# Patient Record
Sex: Female | Born: 2005 | Race: Black or African American | Hispanic: No | Marital: Single | Smoking: Never smoker
Health system: Southern US, Community
[De-identification: ages and names within clinical notes are randomized; demographics above are authoritative.]

## PROBLEM LIST (undated history)

## (undated) HISTORY — PX: OTHER SURGICAL HISTORY: SHX169

---
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
## 2023-07-15 ENCOUNTER — Encounter: Payer: Self-pay | Admitting: Neurology

## 2023-07-15 ENCOUNTER — Ambulatory Visit: Payer: BC Managed Care – PPO | Attending: No Specialty | Admitting: Neurology

## 2023-07-15 VITALS — BP 117/77 | HR 52 | Temp 98.3°F | Resp 18 | Ht 59.0 in | Wt 118.4 lb

## 2023-07-15 DIAGNOSIS — R519 Headache, unspecified: Secondary | ICD-10-CM

## 2023-07-15 DIAGNOSIS — G43719 Chronic migraine without aura, intractable, without status migrainosus: Secondary | ICD-10-CM

## 2023-07-15 MED ORDER — NORTRIPTYLINE HCL 10 MG PO CAPS
10.0000 mg | ORAL_CAPSULE | Freq: Every evening | ORAL | 5 refills | Status: DC
Start: 2023-07-15 — End: 2023-08-06

## 2023-07-15 MED ORDER — RIZATRIPTAN BENZOATE 10 MG PO TBDP
10.0000 mg | ORAL_TABLET | Freq: Two times a day (BID) | ORAL | 5 refills | Status: DC | PRN
Start: 2023-07-15 — End: 2023-11-11

## 2023-07-15 NOTE — Progress Notes (Signed)
07/15/2023    RE:  Heather Escobar  PCP:  Pcp, None, MD   Referring Provider: Self-referred    Chief Complaint:   Chief Complaint   Patient presents with    Headache     History of Present Illness:  Heather Escobar is a 17 y.o. female who is self-referred for headache evaluation. She is accompanied by her mother, who helps with the history.     She has had headache x at least 3 years, onset sometime in high school.   +Family history in mother.   No clear antecedent accident, injury, illness.   She did have minor concussion with gymnastics in the past.   Intermittent at first.   Gradually worsening.   Now constant, daily since x several months.   This pain is frontal; squeezing; mild.   More intense pain can be holocranial and shoots from the base of neck up to posterior occiput.   More intense pain is sharp, throbbing.   More intense pain is associated with nausea, light and sound sensitivity.   Treating with Acetaminophen, sleep.   Pain is 30 days per month/10 days pain is severe.   She had a CT scan done in ED in [place]; results not available at time of visit.   She is using Acetaminophen a few days per week.   6-7 hours of sleep per day.   Drinking minimal water during the day.   Not eating regularly.     Previous Preventive Medications:  N/a    Previous Acute Medications:  Acetaminophen  ASA-Acetaminophen-Caffeine  Methylprednisolone --> didn't help headache    Past Medical History:  History reviewed. No pertinent past medical history.    Past Surgical History:  History reviewed. No pertinent surgical history.    Allergies:  Patient has no known allergies.    Family History:  History reviewed. No pertinent family history.  No other family history related to current complaints.    Social History:  Social History[1]    Medications:  Current Medications[2]    General Exam:  BP 117/77 (BP Site: Left arm, Patient Position: Sitting, Cuff Size: Medium)   Pulse 52   Temp 98.3 F (36.8 C) (Oral)   Resp 18   Ht 1.499 m (4\' 11" )    Wt 53.7 kg (118 lb 6.4 oz)   SpO2 96%   BMI 23.91 kg/m   Gen:  Well-developed, well-nourished.  No acute distress.  Cooperative with exam.  HEENT:  NC/AT.  Neck:  Supple. Normal range of motion.   CV:  RRR.  S1/S2.  No murmurs.    Lungs:  CTA bilaterally.   Extrem:  No edema.  Skin:  No obvious rashes or lesions.   Neuro Exam:  MENTAL STATUS:  Awake, alert, oriented x 3.  Affect is normal. Normal attention and concentration.  Follows commands.  Normal affect and mood.  Speech is fluent, non dysarthric.  No apraxia or neglect.  Remote and short term/recent memory intact. Fund of knowledge appropriate for age/education.    CRANIAL NERVES:    CN I - Not tested.   CN II - Visual fields are full to confrontation.   CN III, IV, VI - PERRL.  EOMI.  No nystagmus.   CN V - Intact facial sensation to light touch.  CN VII - Facial movements symmetric.   CN VIII - Hearing intact to conversational speech.   CN IX, X - Palate elevates symmetrically. Normal phonation.  CN XI - Shoulder shrug  symmetric.   CN XII - Tongue protrusion midline and without fasciculations.   MOTOR:  Normal bulk and tone.  No pronator drift.  Strength is full and symmetric.  No asterixis, tremor, myoclonus or other abnormal movements.   SENSATION:  Intact to light touch.   GAIT:  Normal gait and station.     Investigations:  Labs Reviewed:  No results found for: "WBC", "HGB", "HCT", "MCV", "PLT"  '  Chemistry    No results found for: "NA", "K", "CL", "CO2", "BUN", "CREAT", "GLU" No results found for: "CA", "ALKPHOS", "AST", "ALT", "BILITOTAL"     Imaging:  CT Head Without Contrast 10/25/2008:  IMPRESSION:     NORMAL CT SCAN OF THE HEAD.     Other Studies:  N/a    Records Reviewed:  N/a    Impression:  In summary, Heather Escobar is a 17 y.o. female who presents for headache evaluation. Primary headache type is migraine without aura.  Symptoms are uncontrolled. Given progression from intermittent to daily headache, recommend neuroimaging to exclude a  structural abnormality.     Recommendations:  1. Worsening headaches    2. Intractable chronic migraine without aura and without status migrainosus    -MRI Brain Without Contrast  -Start Nortriptyline 10 mg qHS; Medication dosing schedule and potential side effects were reviewed.  -Trial of Rizatriptan for acute relief; Medication dosing schedule and potential side effects were reviewed.  -Patient was counseled via AVS to follow the SEEDS for success in headache management, including Sleep hygiene, Exercising regularly, Eating healthy and regular meals, Drinking water, keeping a headache Diary, and Stress reduction.  -Patient was counseled via AVS on the risk of acute medication overuse headache and was advised to limit acute medication use to no more than 3 days per week    Follow up in 3 months for continued longitudinal management of chronic disorder.     Adonis Huguenin, MD  [facility]  Board Certified in Adult Neurology by the American Board of Psychiatry and Neurology (ABPN)  Board Certified in Headache Medicine by the [employer] (UCNS)  07/15/2023                 [1]   Social History  Tobacco Use    Smoking status: Never    Smokeless tobacco: Never   Vaping Use    Vaping status: Never Used   Substance Use Topics    Alcohol use: Never    Drug use: Never   [2]   Current Outpatient Medications   Medication Sig Dispense Refill    albuterol sulfate HFA (PROVENTIL) 108 (90 Base) MCG/ACT inhaler Inhale 2 puffs into the lungs every 6 (six) hours as needed       No current facility-administered medications for this visit.

## 2023-07-15 NOTE — Patient Instructions (Addendum)
-  Follow the SEEDS for success in headache management, including Sleep hygiene, Exercising regularly, Eating healthy and regular meals, Drinking water, keeping a headache Diary, and Stress reduction.    -Work on one lifestyle habit at a time   -Getting 8 hours of sleep   -Drinking 72 oz of water   -3 days of exercise per week   -Eating protein at each of your meals and eating 3 meals per day, around the same times    -I've ordered imaging.  You can schedule at any imaging facility that you'd like.  Many patients use:   [facility] [phone]   [facility] [phone]   [facility] [phone]    -Starting Nortriptyline 10 mg nightly for headache prevention    -When you have a more severe headache, try taking Maxalt (generic Rizatriptan).  Take 10 mg pill by mouth at headache onset.  If you still have a headache after 2 hours, you can take a second dose.  Do not take more than 2 pills in 24 hours.  Do not use more than 3 days per week to prevent rebound headaches.  This may cause a flushing reaction.  Please notify your provider if other side effects occur.     -Avoid use of any "triptans" or over-the-counter rescue/abortive medications (e.g. Advil/Ibuprofen, Aleve/Naproxen, Tylenol/Acetaminophen, Excedrin/Aspirin-Acetaminophen-Caffeine) more than 3 days per week to prevent rebound headaches    -Follow up in 4 months

## 2023-07-21 ENCOUNTER — Encounter: Payer: Self-pay | Admitting: Neurology

## 2023-08-04 ENCOUNTER — Ambulatory Visit: Payer: BC Managed Care – PPO | Attending: Neurology

## 2023-08-04 DIAGNOSIS — R519 Headache, unspecified: Secondary | ICD-10-CM | POA: Insufficient documentation

## 2023-08-04 DIAGNOSIS — G43719 Chronic migraine without aura, intractable, without status migrainosus: Secondary | ICD-10-CM | POA: Insufficient documentation

## 2023-08-06 ENCOUNTER — Other Ambulatory Visit: Payer: Self-pay | Admitting: Neurology

## 2023-10-13 ENCOUNTER — Emergency Department
Admission: EM | Admit: 2023-10-13 | Discharge: 2023-10-13 | Disposition: A | Discharge status: Home / Self Care | Payer: BC Managed Care – PPO | Attending: Emergency Medicine | Admitting: Emergency Medicine

## 2023-10-13 DIAGNOSIS — J020 Streptococcal pharyngitis: Secondary | ICD-10-CM | POA: Insufficient documentation

## 2023-10-13 LAB — COVID-19 (SARS-COV-2) & INFLUENZA  A/B, NAA (ROCHE LIAT)
Influenza A RNA: NOT DETECTED
Influenza B RNA: NOT DETECTED
SARS-CoV-2 (COVID-19) RNA: NOT DETECTED

## 2023-10-13 LAB — GROUP A STREP, RAPID ANTIGEN: Group A Strep, Rapid Antigen: POSITIVE — AB

## 2023-10-13 MED ORDER — IBUPROFEN 100 MG/5ML PO SUSP
400.0000 mg | Freq: Once | ORAL | Status: AC
Start: 2023-10-13 — End: 2023-10-13
  Administered 2023-10-13: 400 mg via ORAL
  Filled 2023-10-13: qty 20

## 2023-10-13 MED ORDER — ACETAMINOPHEN 160 MG/5ML PO SUSP/SOLN (WRAP)
650.0000 mg | Freq: Once | ORAL | Status: AC
Start: 2023-10-13 — End: 2023-10-13
  Administered 2023-10-13: 650 mg via ORAL
  Filled 2023-10-13 (×2): qty 20.3

## 2023-10-13 MED ORDER — DEXAMETHASONE SODIUM PHOSPHATE 4 MG/ML IJ SOLN (WRAP) - FOR ORAL USE
8.0000 mg | Freq: Once | INTRAMUSCULAR | Status: AC
Start: 2023-10-13 — End: 2023-10-13
  Administered 2023-10-13: 8 mg via ORAL

## 2023-10-13 MED ORDER — AMOXICILLIN 500 MG PO CAPS
500.0000 mg | ORAL_CAPSULE | Freq: Two times a day (BID) | ORAL | 0 refills | Status: AC
Start: 2023-10-13 — End: 2023-10-20

## 2023-10-13 MED ORDER — ACETAMINOPHEN 160 MG/5ML PO SUSP/SOLN (WRAP)
650.0000 mg | Freq: Once | ORAL | Status: DC
Start: 2023-10-13 — End: 2023-10-13

## 2023-10-13 MED ORDER — AMOXICILLIN-POT CLAVULANATE 400-57 MG/5ML PO SUSR
875.0000 mg | Freq: Once | ORAL | Status: AC
Start: 2023-10-13 — End: 2023-10-13
  Administered 2023-10-13: 875.2 mg via ORAL
  Filled 2023-10-13: qty 1

## 2023-10-13 NOTE — ED Notes (Signed)
 Discharge orders made by provider, health education imparted, advised follow up as needed, advised to come back to ED if symptoms persist for re-evaluation. Left ED in stable condition, patient alert and oriented X 4. No further complaints made.

## 2023-10-13 NOTE — Discharge Instructions (Addendum)
You are seen today for a sore throat.  Your evaluation here was reassuring but did reveal strep pharyngitis.  You will be treated with antibiotics.  Use Tylenol and ibuprofen to help with your symptoms.  Drink tea with honey as well.  Follow-up with your regular doctor.    Return to the ED for worsening symptoms that cannot be controlled at home or if you develop any other concerning symptoms

## 2023-10-13 NOTE — ED Provider Notes (Signed)
 EMERGENCY DEPARTMENT NOTE     Patient initially seen and examined at   ED PHYSICIAN ASSIGNED       Date/Time Event User Comments    10/13/23 0308 Physician Assigned Vangie Bicker, MD assigned as Attending           ED MIDLEVEL (APP) ASSIGNED       None            HISTORY OF PRESENT ILLNESS       Chief Complaint: No chief complaint on file.       17 y.o. female with past medical history as below    Presenting with sore throat onset yesterday.  Patient woke up in the morning with a sore throat, was seen in urgent care and had negative strep and COVID and flu testing.  Tonight was having headaches worsening sore throat and a fever.  Mom reporting fever of 108.0 at home but patient maintained normal sensorium.  Also has painful tender lymph node on the right side.  No cough congestion rhinorrhea sore throat nausea vomiting    Independent Historian (other than patient): No  Additional History Provided by Independent Historian:  MEDICAL HISTORY     Past Medical History:  History reviewed. No pertinent past medical history.    Past Surgical History:  Past Surgical History[1]    Social History:  Social History[2]    Family History:  Family History[3]    Outpatient Medication:  Previous Medications    ALBUTEROL SULFATE HFA (PROVENTIL) 108 (90 BASE) MCG/ACT INHALER    Inhale 2 puffs into the lungs every 6 (six) hours as needed    NORTRIPTYLINE (PAMELOR) 10 MG CAPSULE    TAKE 1 CAPSULE BY MOUTH NIGHTLY.    RIZATRIPTAN (MAXALT-MLT) 10 MG DISINTEGRATING TABLET    Take 1 tablet (10 mg) by mouth 2 (two) times daily as needed for Migraine .  Separate doses by at least 2 hours.  Max of 2 doses in 24 hours. Do not use more than 3 days per week.         REVIEW OF SYSTEMS   See History of Present Illness  PHYSICAL EXAM     ED Triage Vitals [10/13/23 0301]   Encounter Vitals Group      BP 123/70      Systolic BP Percentile       Diastolic BP Percentile       Heart Rate 116      Resp Rate 20      Temp (!) 101.8 F (38.8 C)       Temp Source Temporal      SpO2 99 %      Weight 54 kg      Height 1.499 m      Head Circumference       Peak Flow       Pain Score 9      Pain Loc       Pain Education       Exclude from Growth Chart      Physical Exam     General:  Well-appearing, NAD.   Skin:  Warm, dry. No rash.   Head:  Normocephalic.  Atraumatic.   Neck: Normal ROM, supple.   EENT: EOMI. PERRL. Oropharynx moist.  Tonsillar swelling, erythema, no exudates.  Anterior cervical lymphadenopathy  CV:  RRR, no MRG, 2+ radial  pulses bilaterally. Cap refill <2s.   Respiratory:  CTAB, no W/R/R. Respirations are non-labored.   GI:  Soft, nondistended, nontender.   MSK: No obvious deformity. Grossly normal AROM at major joints.   Neuro:  Alert and oriented.  Clear & fluent speech.    MEDICAL DECISION MAKING     PRIMARY PROBLEM LIST      Acute illness/injury with risk to life or bodily function (based on differential diagnosis or evaluation) DIAGNOSIS: Strep pharyngitis         DISCUSSION      17 year old female presenting with sore throat.  Found to have strep pharyngitis.  Rest of exam reassuring.  Will treat with antibiotics and symptomatic control    If patient is being hospitalized is severe sepsis or septic shock suspected?: N/A          External Records Reviewed?: N/A    Additional Notes              ED Course as of 10/13/23 0353   Tue Oct 13, 2023   0337 Group A Strep, Rapid Antigen(!): Positive [TL]      ED Course User Index  [TL] Steffanie Dunn, MD         Vital Signs: Reviewed the patient's vital signs.   Nursing Notes: Reviewed and utilized available nursing notes.  Medical Records Reviewed: Reviewed available past medical records.  Counseling: The emergency Gaynel Schaafsma has spoken with the patient and discussed today's findings, in addition to providing specific details for the plan of care.  Questions are answered and there is agreement with the plan.      MIPS DOCUMENTATION              CARDIAC STUDIES    The following cardiac studies were  independently interpreted by me the Emergency Medicine Haidy Kackley.  For full cardiac study results please see chart.                                                EMERGENCY IMAGING STUDIES    The following imagine studies were independently interpreted by me (emergency medicine Ieshia Hatcher):                       RADIOLOGY IMAGING STUDIES      No orders to display       EMERGENCY DEPT. MEDICATIONS      ED Medication Orders (From admission, onward)      Start Ordered     Status Ordering Lorayne Getchell    10/13/23 [phone] 10/13/23 0345    Once        Route: Oral  Ordered Dose: 650 mg       Discontinued Steffanie Dunn    10/13/23 0324 10/13/23 0323  acetaminophen (TYLENOL) 160 MG/5ML oral liquid 650 mg  Once        Route: Oral  Ordered Dose: 650 mg       Wendall Stade    10/13/23 0324 10/13/23 0323  ibuprofen (ADVIL) 100 MG/5ML oral suspension 400 mg  Once        Route: Oral  Ordered Dose: 400 mg       Last MAR action: Given ,     10/13/23 0324 10/13/23 0323  dexAMETHasone (DECADRON) INJ soln for ORAL ROUTE 8 mg  Once        Route: Oral  Ordered Dose: 8 mg       Last MAR action: Given ,   10/13/23 0324 10/13/23 0323  amoxicillin-clavulanate (AUGMENTIN) 400-57 MG/5ML oral suspension 875.2 mg  Once        Route: Oral  Ordered Dose: 875 mg of amoxicillin       Last MAR action: Given ,             LABORATORY RESULTS    Ordered and independently interpreted AVAILABLE laboratory tests.   Results       Procedure Component Value Units Date/Time    COVID-19 and Influenza ([place]) (symptomatic) [[id number]]  (Normal) Collected: 10/13/23 0312    Specimen: Swab from Anterior Nares Updated: 10/13/23 0351     SARS-CoV-2 (COVID-19) RNA Not Detected     Influenza A RNA Not Detected     Influenza B RNA Not Detected    Narrative:      A result of "Detected" indicates POSITIVE for the presence of viral RNA  A result of "Not Detected" indicates NEGATIVE for the presence of viral RNA    Test performed using  the Roche cobas [place] SARS-CoV-2 & Influenza A/B assay. This is a multiplex real-time RT-PCR assay for the detection of SARS-CoV-2, influenza A, and influenza B virus RNA. Viral nucleic acids may persist in vivo, independent of viability. Detection of viral nucleic acid does not imply the presence of infectious virus, or that virus nucleic acid is the cause of clinical symptoms. Negative results do not preclude SARS-CoV-2, influenza A, and/or influenza B infection and should not be used as the sole basis for diagnosis, treatment or other patient management decisions. Invalid results may be due to inhibiting substances in the specimen and recollection should occur.     Group A Strep, Rapid Antigen [[id number]]  (Abnormal) Collected: 10/13/23 0312    Specimen: Swab from Throat Updated: 10/13/23 0334     Group A Strep, Rapid Antigen Positive              CRITICAL CARE/PROCEDURES    Procedures  Critical care?  DIAGNOSIS      Diagnosis:  Final diagnoses:   Strep pharyngitis       Disposition:  ED Disposition       ED Disposition   Discharge    Condition   --    Date/Time   Tue Oct 13, 2023  3:46 AM    Comment   [name] discharge to home/self care.    Condition at disposition: Stable                 Prescriptions:  Patient's Medications   New Prescriptions    AMOXICILLIN (AMOXIL) 500 MG CAPSULE    Take 1 capsule (500 mg) by mouth 2 (two) times daily for 7 days   Previous Medications    ALBUTEROL SULFATE HFA (PROVENTIL) 108 (90 BASE) MCG/ACT INHALER    Inhale 2 puffs into the lungs every 6 (six) hours as needed    NORTRIPTYLINE (PAMELOR) 10 MG CAPSULE    TAKE 1 CAPSULE BY MOUTH NIGHTLY.    RIZATRIPTAN (MAXALT-MLT) 10 MG DISINTEGRATING TABLET    Take 1 tablet (10 mg) by mouth 2 (two) times daily as needed for Migraine .  Separate doses by at least 2 hours.  Max of 2 doses in 24 hours. Do not use more than 3 days per week.   Modified Medications    No medications on file   Discontinued Medications    No medications on file            This note was generated  by the Epic EMR system/ Dragon speech recognition and may contain inherent errors or omissions not intended by the user. Grammatical errors, random word insertions, deletions and pronoun errors  are occasional consequences of this technology due to software limitations. Not all errors are caught or corrected. If there are questions or concerns about the content of this note or information contained within the body of this dictation they should be addressed directly with the author for clarification.           [1]   Past Surgical History:  Procedure Laterality Date    Foreigh body removal     [2]   Social History  Socioeconomic History    Marital status: Single   Tobacco Use    Smoking status: Never    Smokeless tobacco: Never   Vaping Use    Vaping status: Never Used   Substance and Sexual Activity    Alcohol use: Never    Drug use: Never     Social Drivers of Psychologist, prison and probation services Strain: Patient Declined (07/01/2023)    Overall Financial Resource Strain (CARDIA)     Difficulty of Paying Living Expenses: Patient declined   Food Insecurity: No Food Insecurity (10/13/2023)    Hunger Vital Sign     Worried About Running Out of Food in the Last Year: Never true     Ran Out of Food in the Last Year: Never true   Transportation Needs: No Transportation Needs (10/13/2023)    PRAPARE - Therapist, art (Medical): No     Lack of Transportation (Non-Medical): No   Physical Activity: Sufficiently Active (07/01/2023)    Exercise Vital Sign     Days of Exercise per Week: 7 days     Minutes of Exercise per Session: 70 min   Stress: Stress Concern Present (07/01/2023)    [employer] of Occupational Health - Occupational Stress Questionnaire     Feeling of Stress : Rather much   Social Connections: Moderately Integrated (07/01/2023)    Social Connection and Isolation Panel [NHANES]     Frequency of Communication with Friends and Family: More than three times a  week     Frequency of Social Gatherings with Friends and Family: More than three times a week     Attends Religious Services: More than 4 times per year     Active Member of [employer] or Organizations: Yes     Attends Banker Meetings: More than 4 times per year     Marital Status: Never married   Intimate Partner Violence: Not At Risk (10/13/2023)    Humiliation, Afraid, Rape, and Kick questionnaire     Fear of Current or Ex-Partner: No     Emotionally Abused: No     Physically Abused: No     Sexually Abused: No   Housing Stability: Unknown (10/13/2023)    Housing Stability Vital Sign     Unable to Pay for Housing in the Last Year: No     Homeless in the Last Year: No   [3] No family history on file.       Steffanie Dunn, MD  10/13/23 [phone]

## 2023-11-11 ENCOUNTER — Ambulatory Visit: Payer: BC Managed Care – PPO | Attending: Neurology | Admitting: Neurology

## 2023-11-11 ENCOUNTER — Telehealth: Payer: Self-pay

## 2023-11-11 ENCOUNTER — Encounter: Payer: Self-pay | Admitting: Neurology

## 2023-11-11 VITALS — BP 112/65 | HR 79 | Ht 59.0 in | Wt 120.0 lb

## 2023-11-11 DIAGNOSIS — G43019 Migraine without aura, intractable, without status migrainosus: Secondary | ICD-10-CM

## 2023-11-11 DIAGNOSIS — T887XXA Unspecified adverse effect of drug or medicament, initial encounter: Secondary | ICD-10-CM

## 2023-11-11 MED ORDER — UBROGEPANT 100 MG PO TABS
100.0000 mg | ORAL_TABLET | Freq: Two times a day (BID) | ORAL | 11 refills | Status: AC | PRN
Start: 2023-11-11 — End: ?

## 2023-11-11 NOTE — Patient Instructions (Signed)
-  Follow the SEEDS for success in headache management, including Sleep hygiene, Exercising regularly, Eating healthy and regular meals, Drinking water, keeping a headache Diary, and Stress reduction.    -Try Bernita Raisin (also called Ubrogepant) for headache relief/rescue.  Take one 100 mg pill at headache onset.  You may repeat once after 2 hours. Max of 2 doses in 24 hours. Bernita Raisin does not cause rebound headaches so it is okay to take multiple days in a row. The most common side effect is nausea.  Please notify your provider if other side effects occur.     -Avoid use of any "triptans" or over-the-counter rescue/abortive medications (e.g. Advil/Ibuprofen, Aleve/Naproxen, Tylenol/Acetaminophen, Excedrin/Aspirin-Acetaminophen-Caffeine) more than 3 days per week to prevent rebound headaches    -Follow up in 6 months

## 2023-11-11 NOTE — Progress Notes (Signed)
 11/11/2023    Chief Complaint:   Chief Complaint   Patient presents with    Headache     History of Present Illness:  Heather Escobar is a 18 y.o. female who presents for headache follow up.  She was last seen on 07/15/2023.  At that time I recommended MRI Brain Without Contrast, starting Nortriptyline , trial of Rizatriptan .     Overall migraine frequency is improved on Nortriptyline .   Down to 2 headache days per month.   No side effects to this.   Rizatriptan  causes significant nausea though.   No new concerns.     History Retained from Initial 07/15/2023 Visit:  She has had headache x at least 3 years, onset sometime in high school.   +Family history in mother.   No clear antecedent accident, injury, illness.   She did have minor concussion with gymnastics in the past.   Intermittent at first.   Gradually worsening.   Now constant, daily since x several months.   This pain is frontal; squeezing; mild.   More intense pain can be holocranial and shoots from the base of neck up to posterior occiput.   More intense pain is sharp, throbbing.   More intense pain is associated with nausea, light and sound sensitivity.   Treating with Acetaminophen , sleep.   Pain is 30 days per month/10 days pain is severe.   She had a CT scan done in ED in [place]; results not available at time of visit.   She is using Acetaminophen  a few days per week.   6-7 hours of sleep per day.   Drinking minimal water during the day.   Not eating regularly.     Previous Preventive Medications:  Nortriptyline     Previous Acute Medications:  Acetaminophen   ASA-Acetaminophen -Caffeine  Methylprednisolone --> didn't help headache  Rizatriptan     Past Medical History:  History reviewed. No pertinent past medical history.    Past Surgical History:  Past Surgical History:   Procedure Laterality Date    Foreigh body removal       Allergies:  Patient has no known allergies.    Family History:  History reviewed. No pertinent family history.  No other family history  related to current complaints.    Social History:  Social History[1]    Medications:  Current Medications[2]    General Exam:  BP 112/65 (BP Site: Left arm, Patient Position: Sitting, Cuff Size: Medium)   Pulse 79   Ht 1.499 m (4' 11)   Wt 54.4 kg (120 lb)   LMP 10/15/2023   BMI 24.24 kg/m   Neuro Exam:    MENTAL STATUS:  Awake, alert, oriented.  Attentive.  Follows commands.  Speech fluent, non-dysarthric, with normal naming.   CRANIAL NERVES:  EOMI.  Face symmetric.  Hearing grossly intact.  Shoulders symmetric.   MOTOR:  Moves all extremities symmetrically antigravity.  No adventitious movements.   COORDINATION:  No dysmetria.   GAIT:  Normal gait and station.     Investigations:  Labs Reviewed:  No results found for: WBC, HGB, HCT, MCV, PLT  '  Chemistry    No results found for: NA, K, CL, CO2, BUN, CREAT, GLU No results found for: CA, ALKPHOS, AST, ALT, BILITOTAL     Imaging:  MRI Brain WO Contrast 08/05/2023:  Impression:   1.No acute intracranial abnormality, hydrocephalus, or mass effect. 2.Few tiny T2/FLAIR hyperintense foci in the frontal white matter. These foci are nonspecific and may only be  incidental. They can reflect minimal gliosis from a chronic small vascular or inflammatory process (to include migraines).     CT Head Without Contrast 10/25/2008:  IMPRESSION:     NORMAL CT SCAN OF THE HEAD.     Other Studies:  N/a    Records Reviewed:  ED notes from 10/13/2023    Impression:  In summary, Heather Escobar is a 18 y.o. female who presents for headache follow up. Primary headache type is migraine without aura.  No significant abnormalities noted on neuroimaging. She lacks a reliable abortive agent that doesn't have side effects.     Recommendations:  1. Intractable migraine without aura and without status migrainosus    2. Medication side effects    -MRI results reviewed; white matter changes noted are common in migraineurs and are not felt to be clinically  significant  -Continue Nortriptyline   -Stop Rizatriptan  due to side effects  -Trial of Ubrogepant  for acute relief; Medication dosing schedule and potential side effects were reviewed.  -Patient was counseled via AVS to follow the SEEDS for success in headache management, including Sleep hygiene, Exercising regularly, Eating healthy and regular meals, Drinking water, keeping a headache Diary, and Stress reduction.  -Patient was counseled via AVS on the risk of acute medication overuse headache and was advised to limit acute medication use to no more than 3 days per week    Follow up in 6 months for continued longitudinal management of chronic disorder.     Leita Palms, MD  [facility]  Board Certified in Adult Neurology by the American Board of Psychiatry and Neurology (ABPN)  Board Certified in Headache Medicine by the [employer] (UCNS)  11/11/2023                 [1]   Social History  Tobacco Use    Smoking status: Never    Smokeless tobacco: Never   Vaping Use    Vaping status: Never Used   Substance Use Topics    Alcohol use: Never    Drug use: Never   [2]   Current Outpatient Medications   Medication Sig Dispense Refill    albuterol sulfate HFA (PROVENTIL) 108 (90 Base) MCG/ACT inhaler Inhale 2 puffs into the lungs every 6 (six) hours as needed      nortriptyline  (PAMELOR ) 10 MG capsule TAKE 1 CAPSULE BY MOUTH NIGHTLY. 90 capsule 2    rizatriptan  (MAXALT -MLT) 10 MG disintegrating tablet Take 1 tablet (10 mg) by mouth 2 (two) times daily as needed for Migraine .  Separate doses by at least 2 hours.  Max of 2 doses in 24 hours. Do not use more than 3 days per week. 12 tablet 5     No current facility-administered medications for this visit.

## 2023-11-11 NOTE — Telephone Encounter (Signed)
 Ubrelvy  100mg     Received eRX via [employer] and beginning benefits investigation. PA required. PA submitted via CMM/KEY:[id number]. PA Case ID #: [id number]    PA outcomes: Approved. Authorization Expiration Date: 11/10/2024    Quantity: #16/30    Copay: Expect $45 with [employer]

## 2023-12-18 ENCOUNTER — Telehealth: Payer: Self-pay | Admitting: Neurology

## 2023-12-18 NOTE — Telephone Encounter (Signed)
 [facility] left v/m stating Dr. Frankey Shown is not available on 7/7. Appt has been rescheduled to 7/10 at 10:50am. If pt calls back please confirm change in date/time

## 2024-03-12 ENCOUNTER — Other Ambulatory Visit: Payer: Self-pay | Admitting: Neurology

## 2024-03-14 ENCOUNTER — Other Ambulatory Visit: Payer: Self-pay

## 2024-03-14 MED ORDER — UBROGEPANT 100 MG PO TABS
100.0000 mg | ORAL_TABLET | Freq: Two times a day (BID) | ORAL | 9 refills | Status: DC | PRN
Start: 2024-03-14 — End: 2024-05-12

## 2024-05-09 ENCOUNTER — Ambulatory Visit: Payer: BC Managed Care – PPO | Admitting: Neurology

## 2024-05-12 ENCOUNTER — Other Ambulatory Visit: Payer: Self-pay

## 2024-05-12 ENCOUNTER — Encounter: Payer: Self-pay | Admitting: Neurology

## 2024-05-12 ENCOUNTER — Ambulatory Visit: Payer: BC Managed Care – PPO | Attending: Neurology | Admitting: Neurology

## 2024-05-12 VITALS — BP 105/72 | HR 78 | Temp 98.0°F | Resp 12 | Ht 59.0 in | Wt 118.0 lb

## 2024-05-12 DIAGNOSIS — G4700 Insomnia, unspecified: Secondary | ICD-10-CM

## 2024-05-12 DIAGNOSIS — G43019 Migraine without aura, intractable, without status migrainosus: Secondary | ICD-10-CM

## 2024-05-12 MED ORDER — UBROGEPANT 100 MG PO TABS
100.0000 mg | ORAL_TABLET | Freq: Two times a day (BID) | ORAL | 11 refills | Status: AC | PRN
Start: 2024-05-12 — End: ?

## 2024-05-12 MED ORDER — NORTRIPTYLINE HCL 10 MG PO CAPS: 10.0000 mg | ORAL_CAPSULE | Freq: Every evening | ORAL | 3 refills | Status: DC

## 2024-05-12 NOTE — Patient Instructions (Signed)
-  Follow the SEEDS for success in headache management, including Sleep hygiene, Exercising regularly, Eating healthy and regular meals, Drinking water, keeping a headache Diary, and Stress reduction.    -No med changes    -Practice good sleep hygiene:  Maintain a regular sleep schedule which incorporates 8-9 hours of sleep each night.   Avoid caffeine, alcohol, and spicy/sugary foods 4-6 hours prior to bedtime.   Avoid excessive napping during the day.   Avoid stressful activities before bedtime and engage in a relaxing bedtime routine.   Do not work, use the computer, use your cell phone, or watch TV while in bed.   Ensure that the bedroom is dark, quiet, and at a comfortable temperature.     -Cognitive Behavioral Therapy for insomnia (CBT-i) can be very helpful.      -There are several resources for this online    -One option is www.cbtforinsomnia.com    -Another is [email or web address]    -Follow up in 9 months

## 2024-05-12 NOTE — Progress Notes (Signed)
 05/12/2024    Chief Complaint:   Chief Complaint   Patient presents with    Migraine     History of Present Illness:  Heather Escobar is a 18 y.o. female who presents for headache follow up.  She was last seen on 11/11/2023.  At that time I recommended continuation of Nortriptyline , trial of Ubrogepant .     She is having 1-2 breakthrough headaches per month.   If she forgets a few doses of Nortriptyline , notes worsening headaches.   Ubrogepant  helpful for acute relief.   No side effects.   She reports continued difficulty with falling asleep.     Interval History 11/11/2023:  Overall migraine frequency is improved on Nortriptyline .   Down to 2 headache days per month.   No side effects to this.   Rizatriptan  causes significant nausea though.   No new concerns.     History Retained from Initial 07/15/2023 Visit:  She has had headache x at least 3 years, onset sometime in high school.   +Family history in mother.   No clear antecedent accident, injury, illness.   She did have minor concussion with gymnastics in the past.   Intermittent at first.   Gradually worsening.   Now constant, daily since x several months.   This pain is frontal; squeezing; mild.   More intense pain can be holocranial and shoots from the base of neck up to posterior occiput.   More intense pain is sharp, throbbing.   More intense pain is associated with nausea, light and sound sensitivity.   Treating with Acetaminophen , sleep.   Pain is 30 days per month/10 days pain is severe.   She had a CT scan done in ED in [place]; results not available at time of visit.   She is using Acetaminophen  a few days per week.   6-7 hours of sleep per day.   Drinking minimal water during the day.   Not eating regularly.     Previous Preventive Medications:  Nortriptyline     Previous Acute Medications:  Acetaminophen   ASA-Acetaminophen -Caffeine  Methylprednisolone --> didn't help headache  Rizatriptan   Ubrogepant     Past Medical History:  History reviewed. No pertinent past  medical history.    Past Surgical History:  Past Surgical History:   Procedure Laterality Date    Foreigh body removal       Allergies:  Patient has no known allergies.    Family History:  History reviewed. No pertinent family history.  No other family history related to current complaints.    Social History:  Social History[1]    Medications:  Current Medications[2]    General Exam:  BP 105/72 (BP Site: Left arm, Patient Position: Sitting, Cuff Size: Medium)   Pulse 78   Temp 98 F (36.7 C)   Resp 12   Ht 1.499 m (4' 11)   Wt 53.5 kg (118 lb)   LMP 05/08/2024   SpO2 98%   BMI 23.83 kg/m   Neuro Exam:    MENTAL STATUS:  Awake, alert, oriented.  Attentive.  Follows commands.  Speech fluent, non-dysarthric, with normal naming.   CRANIAL NERVES:  EOMI.  Face symmetric.  Hearing grossly intact.  Shoulders symmetric.   MOTOR:  Moves all extremities symmetrically antigravity.  No adventitious movements.   COORDINATION:  No dysmetria.   GAIT:  Normal gait and station.     Investigations:  Labs Reviewed:  No results found for: WBC, HGB, HCT, MCV, PLT  '  Chemistry  No results found for: NA, K, CL, CO2, BUN, CREAT, GLU No results found for: CA, ALKPHOS, AST, ALT, BILITOTAL     Imaging:  MRI Brain WO Contrast 08/05/2023:  Impression:   1.No acute intracranial abnormality, hydrocephalus, or mass effect. 2.Few tiny T2/FLAIR hyperintense foci in the frontal white matter. These foci are nonspecific and may only be incidental. They can reflect minimal gliosis from a chronic small vascular or inflammatory process (to include migraines).     CT Head Without Contrast 10/25/2008:  IMPRESSION:     NORMAL CT SCAN OF THE HEAD.     Other Studies:  N/a    Records Reviewed:  N/a    Impression:  In summary, Heather Escobar is a 18 y.o. female who presents for headache follow up. Primary headache type is migraine without aura.  No significant abnormalities noted on neuroimaging.      Recommendations:  1. Intractable migraine without aura and without status migrainosus    2. Insomnia, unspecified type    -Continue Nortriptyline  for migraine prevention  -Continue Ubrogepant  for acute relief  -Info provided on sleep hygiene tips for insomnia  -Patient was counseled via AVS to follow the SEEDS for success in headache management, including Sleep hygiene, Exercising regularly, Eating healthy and regular meals, Drinking water, keeping a headache Diary, and Stress reduction.  -Patient was counseled via AVS on the risk of acute medication overuse headache and was advised to limit acute medication use to no more than 3 days per week    Follow up in 9 months for continued longitudinal management of chronic disorder.     Leita Palms, MD  [facility]  Board Certified in Adult Neurology by the American Board of Psychiatry and Neurology (ABPN)  Board Certified in Headache Medicine by the [employer] (UCNS)  05/12/2024                 [1]   Social History  Tobacco Use    Smoking status: Never    Smokeless tobacco: Never   Vaping Use    Vaping status: Never Used   Substance Use Topics    Alcohol use: Never    Drug use: Never   [2]   Current Outpatient Medications   Medication Sig Dispense Refill    albuterol sulfate HFA (PROVENTIL) 108 (90 Base) MCG/ACT inhaler Inhale 2 puffs into the lungs every 6 (six) hours as needed      nortriptyline  (PAMELOR ) 10 MG capsule TAKE 1 CAPSULE BY MOUTH NIGHTLY. 90 capsule 2    ubrogepant  (UBRELVY ) 100 MG tablet Take 1 tablet (100 mg) by mouth 2 (two) times daily as needed (migraine) 16 tablet 9     No current facility-administered medications for this visit.

## 2024-07-25 ENCOUNTER — Encounter: Payer: Self-pay | Admitting: Neurology

## 2024-10-07 ENCOUNTER — Other Ambulatory Visit: Payer: Self-pay

## 2024-12-07 ENCOUNTER — Other Ambulatory Visit: Payer: Self-pay | Admitting: Neurology

## 2024-12-07 ENCOUNTER — Other Ambulatory Visit: Payer: Self-pay

## 2024-12-07 ENCOUNTER — Encounter: Payer: Self-pay | Admitting: Neurology

## 2024-12-07 MED ORDER — NORTRIPTYLINE HCL 10 MG PO CAPS
10.0000 mg | ORAL_CAPSULE | Freq: Every evening | ORAL | 1 refills | Status: AC
  Filled 2024-12-07: qty 90, 90d supply, fill #0
